# Patient Record
Sex: Female | Born: 1940 | Race: White | Hispanic: No | Marital: Married
Health system: Southern US, Community
[De-identification: ages and names within clinical notes are randomized; demographics above are authoritative.]

---

## 2009-10-27 ENCOUNTER — Ambulatory Visit: Payer: Self-pay | Admitting: Internal Medicine

## 2009-10-29 ENCOUNTER — Ambulatory Visit: Payer: Self-pay | Admitting: Internal Medicine

## 2009-10-31 ENCOUNTER — Ambulatory Visit: Payer: Self-pay | Admitting: Internal Medicine

## 2009-11-03 ENCOUNTER — Ambulatory Visit: Payer: Self-pay | Admitting: Internal Medicine

## 2010-07-04 ENCOUNTER — Ambulatory Visit: Payer: Self-pay | Admitting: Internal Medicine

## 2011-03-10 ENCOUNTER — Ambulatory Visit: Payer: Self-pay | Admitting: Internal Medicine

## 2013-10-20 ENCOUNTER — Ambulatory Visit: Payer: Self-pay | Admitting: Unknown Physician Specialty

## 2013-10-20 LAB — URINALYSIS, COMPLETE
Blood: NEGATIVE
Glucose,UR: NEGATIVE mg/dL (ref 0–75)
Nitrite: NEGATIVE
Ph: 5 (ref 4.5–8.0)
SPECIFIC GRAVITY: 1.015 (ref 1.003–1.030)

## 2013-10-22 LAB — URINE CULTURE

## 2013-11-17 ENCOUNTER — Ambulatory Visit: Payer: Self-pay | Admitting: Family Medicine

## 2013-11-23 LAB — BASIC METABOLIC PANEL
Anion Gap: 6 — ABNORMAL LOW (ref 7–16)
BUN: 22 mg/dL — ABNORMAL HIGH (ref 7–18)
CALCIUM: 8.8 mg/dL (ref 8.5–10.1)
CHLORIDE: 96 mmol/L — AB (ref 98–107)
CO2: 28 mmol/L (ref 21–32)
Creatinine: 1.15 mg/dL (ref 0.60–1.30)
EGFR (African American): 54 — ABNORMAL LOW
EGFR (Non-African Amer.): 47 — ABNORMAL LOW
Glucose: 147 mg/dL — ABNORMAL HIGH (ref 65–99)
Osmolality: 267 (ref 275–301)
POTASSIUM: 4.2 mmol/L (ref 3.5–5.1)
Sodium: 130 mmol/L — ABNORMAL LOW (ref 136–145)

## 2013-11-23 LAB — URINALYSIS, COMPLETE
BILIRUBIN, UR: NEGATIVE
BLOOD: NEGATIVE
Bacteria: NONE SEEN
Ketone: NEGATIVE
Leukocyte Esterase: NEGATIVE
NITRITE: NEGATIVE
PH: 5 (ref 4.5–8.0)
Protein: 30
RBC,UR: 1 /HPF (ref 0–5)
SPECIFIC GRAVITY: 1.025 (ref 1.003–1.030)
Squamous Epithelial: 1
WBC UR: 3 /HPF (ref 0–5)

## 2013-11-23 LAB — CBC WITH DIFFERENTIAL/PLATELET
Basophil #: 0 10*3/uL (ref 0.0–0.1)
Basophil %: 0.1 %
EOS ABS: 0 10*3/uL (ref 0.0–0.7)
EOS PCT: 0.2 %
HCT: 32.3 % — AB (ref 35.0–47.0)
HGB: 11 g/dL — ABNORMAL LOW (ref 12.0–16.0)
LYMPHS PCT: 1.9 %
Lymphocyte #: 0.3 10*3/uL — ABNORMAL LOW (ref 1.0–3.6)
MCH: 30.1 pg (ref 26.0–34.0)
MCHC: 33.9 g/dL (ref 32.0–36.0)
MCV: 89 fL (ref 80–100)
MONOS PCT: 5.9 %
Monocyte #: 1 x10 3/mm — ABNORMAL HIGH (ref 0.2–0.9)
NEUTROS ABS: 15 10*3/uL — AB (ref 1.4–6.5)
NEUTROS PCT: 91.9 %
Platelet: 272 10*3/uL (ref 150–440)
RBC: 3.64 10*6/uL — AB (ref 3.80–5.20)
RDW: 13.1 % (ref 11.5–14.5)
WBC: 16.3 10*3/uL — AB (ref 3.6–11.0)

## 2013-11-24 ENCOUNTER — Inpatient Hospital Stay: Payer: Self-pay | Admitting: Internal Medicine

## 2013-11-24 LAB — CBC WITH DIFFERENTIAL/PLATELET
BASOS ABS: 0 10*3/uL (ref 0.0–0.1)
Basophil %: 0.3 %
Eosinophil #: 0 10*3/uL (ref 0.0–0.7)
Eosinophil %: 0.1 %
HCT: 31.4 % — AB (ref 35.0–47.0)
HGB: 10.7 g/dL — ABNORMAL LOW (ref 12.0–16.0)
LYMPHS ABS: 0.4 10*3/uL — AB (ref 1.0–3.6)
LYMPHS PCT: 2.5 %
MCH: 30.2 pg (ref 26.0–34.0)
MCHC: 34 g/dL (ref 32.0–36.0)
MCV: 89 fL (ref 80–100)
Monocyte #: 1.2 x10 3/mm — ABNORMAL HIGH (ref 0.2–0.9)
Monocyte %: 7.7 %
NEUTROS PCT: 89.4 %
Neutrophil #: 13.8 10*3/uL — ABNORMAL HIGH (ref 1.4–6.5)
Platelet: 238 10*3/uL (ref 150–440)
RBC: 3.53 10*6/uL — AB (ref 3.80–5.20)
RDW: 12.9 % (ref 11.5–14.5)
WBC: 15.4 10*3/uL — ABNORMAL HIGH (ref 3.6–11.0)

## 2013-11-24 LAB — BASIC METABOLIC PANEL
ANION GAP: 6 — AB (ref 7–16)
BUN: 16 mg/dL (ref 7–18)
CHLORIDE: 97 mmol/L — AB (ref 98–107)
CREATININE: 0.91 mg/dL (ref 0.60–1.30)
Calcium, Total: 8.3 mg/dL — ABNORMAL LOW (ref 8.5–10.1)
Co2: 29 mmol/L (ref 21–32)
GLUCOSE: 111 mg/dL — AB (ref 65–99)
OSMOLALITY: 266 (ref 275–301)
POTASSIUM: 3.8 mmol/L (ref 3.5–5.1)
Sodium: 132 mmol/L — ABNORMAL LOW (ref 136–145)

## 2013-11-25 LAB — CREATININE, SERUM: Creatinine: 0.93 mg/dL (ref 0.60–1.30)

## 2013-11-26 LAB — BASIC METABOLIC PANEL
Anion Gap: 9 (ref 7–16)
BUN: 21 mg/dL — AB (ref 7–18)
CREATININE: 0.73 mg/dL (ref 0.60–1.30)
Calcium, Total: 8.3 mg/dL — ABNORMAL LOW (ref 8.5–10.1)
Chloride: 101 mmol/L (ref 98–107)
Co2: 30 mmol/L (ref 21–32)
EGFR (Non-African Amer.): >60
Glucose: 97 mg/dL (ref 65–99)
Osmolality: 282 (ref 275–301)
POTASSIUM: 3.7 mmol/L (ref 3.5–5.1)
SODIUM: 140 mmol/L (ref 136–145)

## 2013-11-26 LAB — CBC WITH DIFFERENTIAL/PLATELET
BASOS PCT: 0.2 %
Basophil #: 0 10*3/uL (ref 0.0–0.1)
EOS ABS: 0.2 10*3/uL (ref 0.0–0.7)
Eosinophil %: 1.6 %
HCT: 31.2 % — AB (ref 35.0–47.0)
HGB: 10.3 g/dL — ABNORMAL LOW (ref 12.0–16.0)
Lymphocyte #: 0.8 10*3/uL — ABNORMAL LOW (ref 1.0–3.6)
Lymphocyte %: 6.9 %
MCH: 29.8 pg (ref 26.0–34.0)
MCHC: 33.1 g/dL (ref 32.0–36.0)
MCV: 90 fL (ref 80–100)
MONOS PCT: 7 %
Monocyte #: 0.9 x10 3/mm (ref 0.2–0.9)
Neutrophil #: 10.3 10*3/uL — ABNORMAL HIGH (ref 1.4–6.5)
Neutrophil %: 84.3 %
Platelet: 269 10*3/uL (ref 150–440)
RBC: 3.47 10*6/uL — AB (ref 3.80–5.20)
RDW: 13 % (ref 11.5–14.5)
WBC: 12.2 10*3/uL — ABNORMAL HIGH (ref 3.6–11.0)

## 2013-11-26 LAB — VANCOMYCIN, TROUGH: VANCOMYCIN, TROUGH: 5 ug/mL — AB (ref 10–20)

## 2013-11-26 LAB — CULTURE, BLOOD (SINGLE)

## 2013-11-27 LAB — VANCOMYCIN, TROUGH: VANCOMYCIN, TROUGH: 13 ug/mL (ref 10–20)

## 2013-12-03 LAB — CULTURE, BLOOD (SINGLE)

## 2013-12-18 ENCOUNTER — Ambulatory Visit: Payer: Self-pay | Admitting: Family Medicine

## 2013-12-23 ENCOUNTER — Emergency Department: Payer: Self-pay | Admitting: Emergency Medicine

## 2014-01-30 ENCOUNTER — Emergency Department: Payer: Self-pay | Admitting: Emergency Medicine

## 2014-02-17 DEATH — deceased

## 2014-08-10 NOTE — Discharge Summary (Signed)
PATIENT NAME:  Briana ContrasKEATING, Jessenya M MR#:  [id number] DATE OF BIRTH:  [birth year]  DATE OF ADMISSION:  11/24/2013 DATE OF DISCHARGE:  11/28/2013  DISCHARGE DIAGNOSES:  1.  Clinical sepsis present on admission with methicillin-resistant Staphylococcus aureus growing in the blood culture. Will require 2 weeks of intravenous vancomycin through peripherally inserted central catheter line and moxifloxacin.  2.  Acute respiratory failure, now resolved.   SECONDARY DIAGNOSES:  1.  Vascular dementia. 2.  Gastroesophageal reflux disease.  3.  Chronic obstructive pulmonary disease.  4.  Hypertension.  5.  Osteoarthritis.  6.  Known sacral decubitus ulcer.   CONSULTATIONS:  1.  Surgery, Dr. Dionne Miloichard Cooper.  2.  Infectious disease, Dr. Clydie Braunavid Fitzgerald.   PROCEDURES AND RADIOLOGY: Chest x-ray on August 11 showed placement of right upper extremity PICC line with tape terminating at the mid superior vena cava. No acute cardiopulmonary disease.   Chest x-ray on August 7 showed no acute cardiopulmonary disease.   A 2-D echocardiogram on August 9 showed LVEF of 55%-60%, moderately increased LV septal thickness. Mild to moderate aortic valve sclerosis/calcification without any evidence of aortic stenosis. Moderately increased LV posterior wall thickness.   MAJOR LABORATORY PANEL: Urinalysis on admission was negative.   Blood culture x 2 were growing MRSA.   HISTORY AND SHORT HOSPITAL COURSE: The patient is a 74 year old female with the above-mentioned medical problems who was admitted for altered mental status. thought to have clinical sepsis present on admission from infected sacral decubitus ulcer. Please see Dr. Nicholes MangoKatherine Walsh's dictated history and physical for further details. The patient was started on IV antibiotic. Surgical consultation was obtained with Dr. Dionne Miloichard Cooper who recommended debridement, although considering her advanced dementia and comorbidities, family chose to keep her only on IV  antibiotics and no surgical debridement.  Palliative Care consultation was obtained with Dr. Harvie JuniorPhifer who had a discussion with family, but patient did not meet any hospice criteria at this point. Infectious disease consultation was obtained with Dr. Clydie Braunavid Fitzgerald who recommended 2 weeks of IV vancomycin along with moxifloxacin for ongoing treatment of infection including methicillin-resistant Staphylococcus aureus bacteremia from sacral decubitus ulcer along with possible any other infection. The patient was feeling a whole lot better, was evaluated by physical therapy and was recommended rehabilitation where she is being discharged in fair condition.   PHYSICAL EXAMINATION:  VITAL SIGNS: On the date of discharge, her vital signs are as follows: Temperature 98.1, heart rate 76 per minute, respirations 18 per minute, blood pressure 140/84 mmHg.  She is saturating 94% on room air.   PERTINENT PHYSICAL EXAMINATION ON THE DATE OF DISCHARGE:  CARDIOVASCULAR: S1, S2 normal. No murmurs, rubs, or gallop.  LUNGS: Clear to auscultation bilaterally. No wheezing, rales, rhonchi, or crepitation.  ABDOMEN: Soft and benign.  NEUROLOGIC: Nonfocal examination.  SKIN: She has about 8-10 cm area of erythema and induration over the sacrum, but hemorrhagic area of about 4 cm with full skin thickness ulceration of 2 x 2 cm covered with white eschar.  No drainage or odor.   ALL OTHER PHYSICAL EXAMINATION REMAINED AT BASELINE.   DISCHARGE MEDICATIONS:  1.  Amlodipine 5 mg p.o. daily.  2.  Acetaminophen 650 mg p.o. 3 times a day. 3.  Aspirin 81 mg p.o. daily.  4.  Citalopram 20 mg p.o. daily.  5.  Losartan 50 mg p.o. daily.  6.  Multivitamin once daily.  7.  Nabumetone 500 mg 1 tablet p.o. b.i.d.  8.  Omeprazole 40 mg p.o. b.i.d.  9.  Pravastatin 20 mg p.o. daily.  10.  Qvar 2 puffs inhaled twice a day.  11.  Spiriva once daily.  12.  ProAir 2 puffs inhaled every 4 hours as needed. 13.  Vancomycin 1 gram IV  Twice daily for 14 days.  14.  Moxifloxacin 400 mg p.o. daily for 14 days.   DISCHARGE DIET: Regular.   DISCHARGE ACTIVITY: As tolerated.   DISCHARGE INSTRUCTIONS AND FOLLOW-UP: The patient was instructed to have a dressing change twice a day in the sacral area of the Santyl dressing material. Dressing needs to be kept dry. She will need to PICC line care per protocol.  She was also instructed to have Ensure drinks 3 times a day with each meal. She will get physical therapy evaluation and management while at the facility. She will need to be seeing her primary care physician or physician at the facility in the next 1 week. She was instructed to follow up with Dr. Clydie Braun in 2 weeks.   TOTAL TIME DISCHARGING THIS PATIENT: 45 minutes.   She remains at very high risk for readmission. Her CODE STATUS is DO NOT RESUSCITATE.    ____________________________ Vanette Noguchi S. Sherryll Burger, MD vss:TT D: 11/28/2013 11:12:38 ET T: 11/28/2013 12:13:03 ET JOB#: [id number]  cc: Gelene Recktenwald S. Sherryll Burger, MD, <Dictator> Richard E. Excell Seltzer, MD Stann Mainland. Sampson Goon, MD Unknown Primary Care Physician  Ellamae Sia [facility] MD ELECTRONICALLY SIGNED 11/29/2013 18:59

## 2014-08-10 NOTE — Discharge Summary (Signed)
PATIENT NAME:  Briana ContrasKEATING, Fritzi M MR#:  [id number] DATE OF BIRTH:  1940/06/10  DATE OF ADMISSION:  11/24/2013 DATE OF DISCHARGE:    ADDENDUM:    The only change is that the vancomycin dosing will be 1000 mg twice a day or every 12, as opposed to 1000 mg daily.  The next trough should be checked prior to the fourth dose, with a goal trough of 15 to 20.    ____________________________ Stann Mainlandavid P. Sampson GoonFitzgerald, MD dpf:nt D: 11/28/2013 13:53:51 ET T: 11/28/2013 14:01:37 ET JOB#: [id number]  cc: Stann Mainlandavid P. Sampson GoonFitzgerald, MD, <Dictator> Blair Lundeen Sampson GoonFITZGERALD MD ELECTRONICALLY SIGNED 12/07/2013 22:24

## 2014-08-10 NOTE — Consult Note (Signed)
PATIENT NAME:  Briana Poole, [name] MR#:  [id number] DATE OF BIRTH:  1940-06-05  DATE OF CONSULTATION:  11/24/2013  REFERRING PHYSICIAN:   CONSULTING PHYSICIAN:  Adah Salvageichard E. Excell Seltzerooper, MD  CHIEF COMPLAINT: Sacral decubitus.   HISTORY OF PRESENT ILLNESS: This is an elderly patient with a sacral decubitus I was asked to see for evaluation. She was apparently admitted with septic signs. No family is present at this time. History is from the chart, as the patient is essentially incoherent and responds only to voice and painful stimulus, but does not give coherent answers to any questioning.   PAST MEDICAL HISTORY: Dementia, reflux disease, COPD, hypertension, arthritis, and a prior sacral decubitus ulcer.   PAST SURGICAL HISTORY: Not obtainable.   MEDICATIONS: Multiple, see chart.   ALLERGIES: AMOXICILLIN, SULFA, CYMBALTA AND PENICILLIN.   FAMILY HISTORY: Not obtainable.   SOCIAL HISTORY: The patient is a resident at an assisted living facility. Does not smoke or drink.   REVIEW OF SYSTEMS: Not obtainable.   PHYSICAL EXAMINATION: GENERAL: Afebrile. The patient preferring to lay in the left lateral recumbent position. She awakens to voice, but only very loud voice and to palpation stimuli (no family is present during the examination).   With the assistance of the nurse, the patient is rolled to her left side and dressing is taken down. There is erythema, which is minimal. There is no expressible pus. There is an apparent and obvious sacral decubitus measuring approximately 2.5 x 3 cm.   LABORATORY VALUES: Sodium 132, white blood cell count of 15, hemoglobin and hematocrit 10.7 and 32. Cultures show gram-positive cocci in the blood.   ASSESSMENT AND PLAN: This is a patient with a sacral decubitus. My initial impression was that this patient would benefit from IV antibiotics and possible debridement if there is no rapid response with the erythema; however, I see Dr. Renae GlossWieting has talked to the family  who is currently not present, who have discussed possible and likely comfort care only. I am in full agreement with this plan and would be available if needed. We will continue to follow the patient in case something changes at this time.    ____________________________ Adah Salvageichard E. Excell Seltzerooper, MD rec:at D: 11/24/2013 12:14:24 ET T: 11/24/2013 12:24:52 ET JOB#: [id number]  cc: Adah Salvageichard E. Excell Seltzerooper, MD, <Dictator> Lattie HawICHARD E Camari Wisham MD ELECTRONICALLY SIGNED 11/24/2013 16:46

## 2014-08-10 NOTE — H&P (Signed)
PATIENT NAME:  Briana Poole, Briana Poole MR#:  [id number] DATE OF BIRTH:  03/25/1941  DATE OF ADMISSION:  11/23/2013  PRIMARY CARE PHYSICIAN: Unknown at this time.  REFERRING PHYSICIAN:  Dr. Clydene PughWoodard.   CHIEF COMPLAINT: Altered mental status.   HISTORY OF PRESENT ILLNESS: The patient is a poor historian due to advanced dementia. The history is obtained from her living facility. Per their report, the patient has been less active for the past few days. She is usually up and able to walk, recently she has been remaining in the bed. There have been no fevers, sweats, shortness of breath, specific complaints. She has a sacral ulcer which has been getting more inflamed and large over the past few weeks.  PAST MEDICAL HISTORY: 1.  Vascular dementia.  2.  Gastroesophageal reflux disease.  3.  COPD.  4.  Hypertension.  5.  Osteoarthritis.  6.  Known sacral decubitus ulcer.   PAST SURGICAL HISTORY: The patient has had surgery on her lungs in the past.   HOME MEDICATIONS:  1.  Spiriva 18 mcg inhalation capsule inhale 1 capsule once a day.  2.  Qvar 80 mcg/inhalations 2 puffs inhaled twice a day.  3.  ProAir HFA CFC free 90 mcg/inhalation aerosol inhaler 2 puffs every 4 hours as needed for shortness of breath.  4.  Pravastatin 20 mg 1 tablet daily.  5.  Omeprazole 40 mg 1 capsule 2 times a day.  6.  Nabumetone 500 mg oral tablet 1 tablet twice a day.  7.  Multivitamin once a day.  8.  Losartan 50 mg 1 tablet daily.  9.  Citalopram 20 mg 1 tablet daily.  10.  Aspirin 81 mg 1 tablet daily.  11.  Amlodipine 5 mg 1 tablet daily.  12.  Acetaminophen 650 mg 1 tablet orally 3 times a day.   ALLERGIES: THE PATIENT IS ALLERGIC TO AMOXICILLIN WHICH CAUSES RASH, SULFA DRUGS, CYMBALTA, FLU VACCINATION, AND PENICILLIN.   SOCIAL HISTORY: The patient is a resident at an assisted living facility. She does not smoke or drink alcohol. Otherwise, unable to obtain due to her advanced dementia.   FAMILY HISTORY: Unable  to obtain due to advanced dementia.   REVIEW OF SYSTEMS: Unable to obtain due to her advanced dementia.   PHYSICAL EXAMINATION:  VITAL SIGNS: Temperature 99.3, pulse 94, respirations 27, blood pressure 145/85, pulse oximeter 95% on room air.  GENERAL: The patient is alert, she is not oriented. She seems comfortable in the exam bed, calm.  HEENT: Pupils are equal, round, and reactive, conjunctiva are clear, oral mucous membranes are pink and moist, good dentition.  NECK: Supple, no lymphadenopathy, no thyromegaly.  PULMONARY: The patient does not participate well with the examination. Lungs are clear to auscultation bilaterally.   CARDIOVASCULAR: Regular rate and rhythm, no murmurs, rubs, or gallops.  ABDOMEN: Bowel sounds are normal, abdomen seems slightly tender to touch diffusely. There is no point of greater tenderness. There is no mass, no guarding, no rebound, no hepatosplenomegaly.  MUSCULOSKELETAL: The patient is very thin, muscles are atrophied. There are no swollen or tender joints. She is able to move all 4 extremities spontaneously.   NEUROLOGIC: The patient is unable to perform the neurologic exam due to advanced dementia. She is not oriented to person, place, or situation.  SKIN: there is a 8-10 cm area of erythema and induration over the sacrum with a central hemoragic area of about 4 cm and a full skin thickness ulceration of 2cmx2cm which is  covered with a white eschar.  No drainage, no odor.  LABORATORY DATA:  Sodium 130, potassium 4.2, chloride 96, bicarbonate 28, BUN 22, creatinine 1.15, glucose 147. White blood cell count 16.3, hemoglobin 11.0, hematocrit 32.3, platelets 272,000. MCV is 89. Urinalysis shows no sign of infection.   ASSESSMENT AND PLAN:  1.  Infected sacral decubitus ulcer: Will cover broadly with vancomycin and clindamycin (patient is PCN allergic). Will obtain blood cultures. We will order a wound care consultation.   2.  Chronic obstructive pulmonary  disease: Continue home regimen, she is not having a chronic obstructive pulmonary disease exacerbation at this time.    3.  Hypertension. Continue home regimen.   4.  Gastroesophageal reflux disease: Continue proton pump inhibitor.   TIME SPENT THIS ADMISSION: 35 minutes.    ____________________________ Ena Dawley. Clent Ridges, MD cpw:ts D: 11/23/2013 18:26:57 ET T: 11/23/2013 19:41:06 ET JOB#: [id number]  cc: Santina Evans P. Clent Ridges, MD, <Dictator> Gale Journey MD ELECTRONICALLY SIGNED 11/23/2013 22:21

## 2014-08-10 NOTE — Consult Note (Signed)
PATIENT NAME:  Antony ContrasKEATING, Modupe M MR#:  [id number] DATE OF BIRTH:  [birth year]  DATE OF CONSULTATION:  11/28/2013  REQUESTING PHYSICIAN:    Delfino LovettVipul Shah, MD    CONSULTING PHYSICIAN:  Stann Mainlandavid P. Sampson GoonFitzgerald, MD   REASON FOR CONSULTATION:  Methicillin resistant Staphylococcus aureus bacteremia and sacral decubitus ulcer.   HISTORY OF PRESENT ILLNESS: This is a 74 year old female admitted August 7th with altered mental status. She has a history of advanced dementia.  She had been increasingly bed bound and has developed a sacral ulcer which had increased in size and progression over the last month. On admission, the patient was started on vancomycin and clindamycin. She was seen by wound care. Blood cultures ended up being positive for MRSA.  We are consulted for further antibiotic management.   PAST MEDICAL HISTORY:  1.  Dementia, vascular in nature.  2.  GERD.  3.  COPD.   4.  Hypertension.  5.  Osteoarthritis.  6.  Sacral decubitus ulcer as above.   SURGICAL HISTORY:  Prior lung surgery, unknown type.   SOCIAL HISTORY: The patient is a resident at assisted living facility. No tobacco or alcohol or drugs.   FAMILY HISTORY: Unobtainable.   REVIEW OF SYSTEMS:  Unable to be obtained due to dementia.   ALLERGIES: AMOXICILLIN, SULFA, CYMBALTA, FLU VACCINE AND PENICILLIN.   ANTIBIOTICS. The patient was started on vancomycin and clindamycin.   PHYSICAL EXAMINATION:  VITAL SIGNS: Temperature 98.1, pulse 76, blood pressure 179/94, respirations 18, sat 94%. She had been afebrile throughout her hospitalization. GENERAL:  She is lying in bed. She has dementia.  Her pupils are equal, round, and reactive to light and accommodation.  Extraocular movements are intact.  OROPHARYNX: Clear.  NECK: Supple.  HEART: Regular.  LUNGS: Clear.  ABDOMEN: Soft, nontender, nondistended.  EXTREMITIES: No clubbing, cyanosis or edema.  SKIN: She has a stage III decubitus ulcer.   LABORATORY DATA:  Blood cultures  done on August 7th are positive for MRSA.  Follow-up blood cultures August 12th were no growth to date.  White blood count on admission was 16.3; currently, it is 12.2, hemoglobin 10.3, platelets 269. Urinalysis done on admission showed only 3 white cells. Echocardiogram showed no evidence of vegetations. Renal function shows a creatinine of 0.73.   IMAGING: Chest x-ray August 7th showed mild cardiac enlargement and mild chronic interstitial changes.   IMPRESSION: A 74 year old with vascular dementia admitted with sepsis due to a methicillin-resistant Staphylococcus aureus bacteremia from a sacral wound. She has been seen by surgery.      RECOMMENDATIONS:  1.  Repeating blood cultures today, which are negative to date.  2.  Continue vancomycin for a 2-week course.  3.  Add moxifloxacin for coverage of the other organisms likely present with the wound.  4.  Santyl for the wound and wound care.  5.  I can see the patient in 2 weeks in clinic.  Thank you for the consult. Be glad to follow with you.     ____________________________ Stann Mainlandavid P. Sampson GoonFitzgerald, MD dpf:DT D: 12/10/2013 11:45:43 ET T: 12/10/2013 13:19:34 ET JOB#: [id number]  cc: Stann Mainlandavid P. Sampson GoonFitzgerald, MD, <Dictator> Pavel Gadd Sampson GoonFITZGERALD MD ELECTRONICALLY SIGNED 12/27/2013 10:22

## 2014-08-10 NOTE — Consult Note (Signed)
Brief Consult Note: Diagnosis: MRSA bacteremia, sacral decubitis ulcer.   Patient was seen by consultant.   Consult note dictated.   Recommend to proceed with surgery or procedure.   Orders entered.   Discussed with Attending MD.   Comments: Continue vanco fo 2 weeks repeat bcx today Santyl to wound to help remove the exudate. WIll need good wound care Moxifloxacin in addition to the vanco for the infected sacral wound I can see in clinic in 2 weeks.  Electronic Signatures: Dierdre HarnessFitzgerald, David Patrick (MD)  (Signed 12-Aug-15 14:02)  Authored: Brief Consult Note   Last Updated: 12-Aug-15 14:02 by Dierdre HarnessFitzgerald, David Patrick (MD)

## 2016-03-24 IMAGING — CR DG CHEST 1V PORT
1 series · 1 of 1 positions shown · non-contrast
Comparison: None.

CLINICAL DATA: Shortness of breath

EXAM:
PORTABLE CHEST - 1 VIEW

[ap]
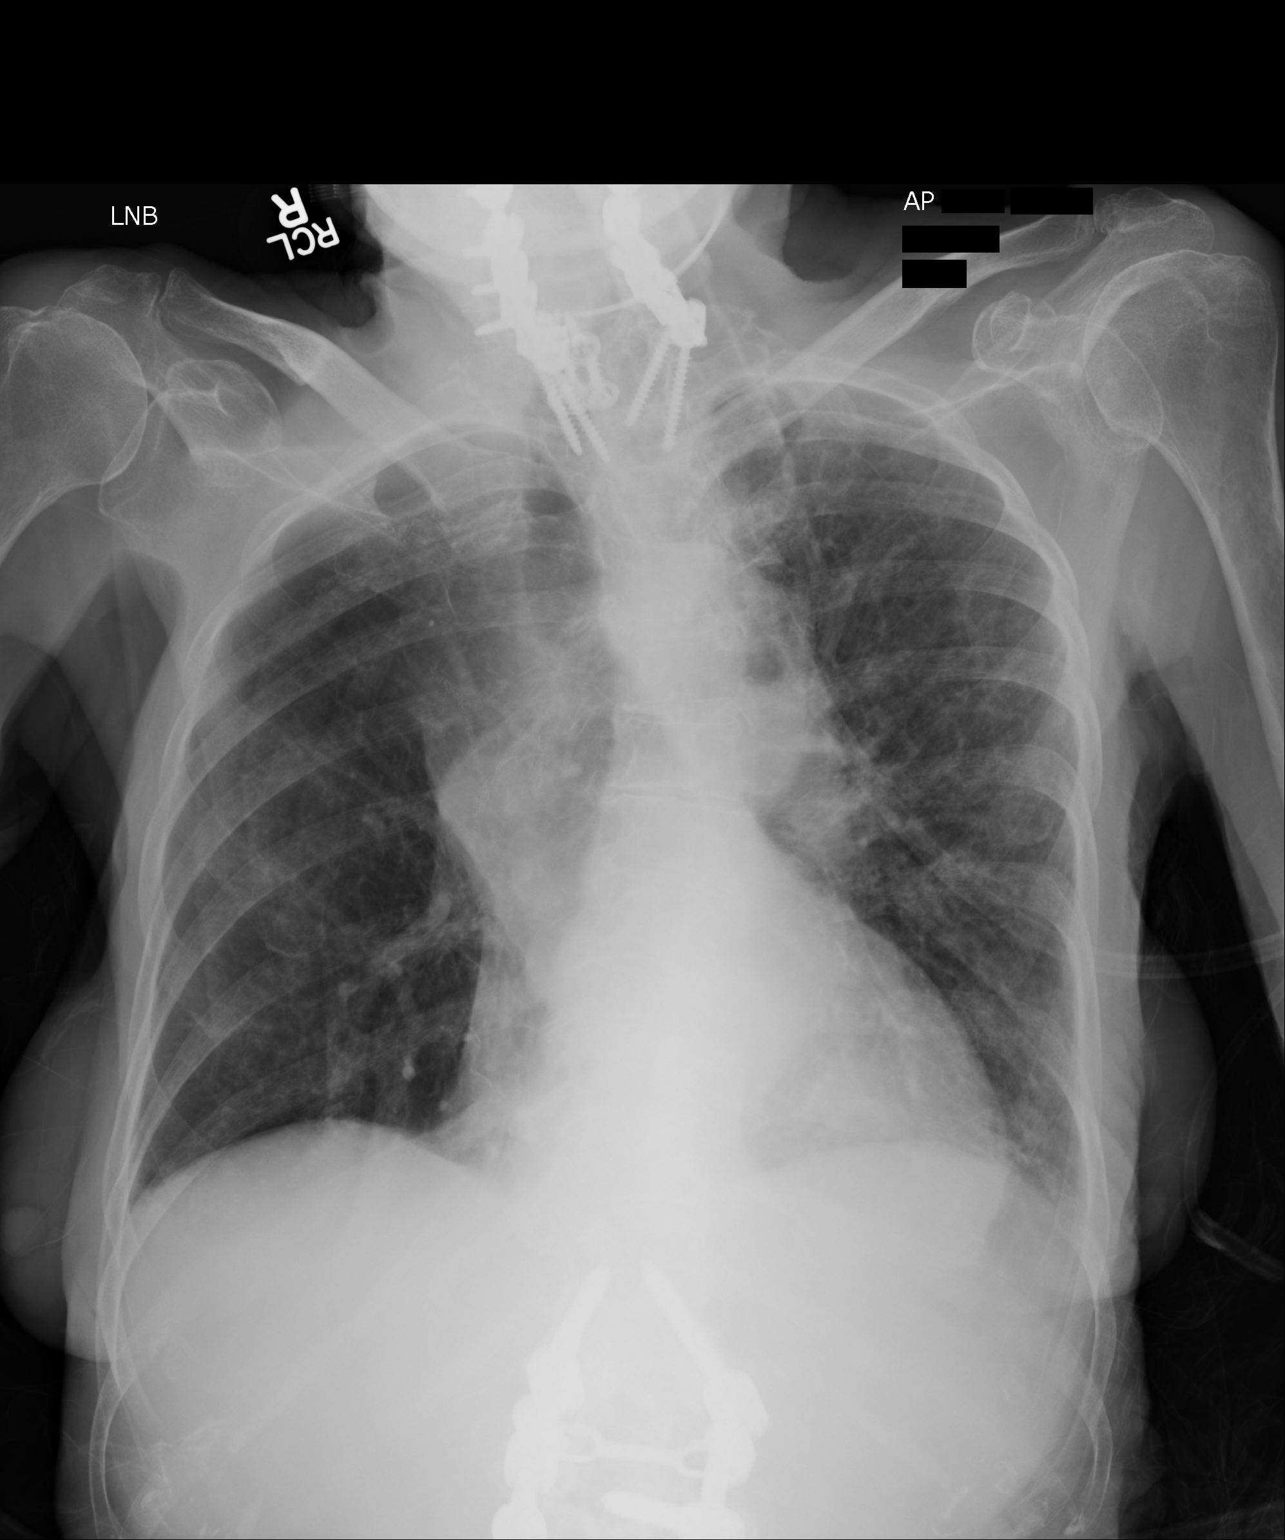

[1 of 1 positions shown; findings below may reference images not displayed]

FINDINGS: Mild cardiac enlargement. Vascular pattern normal. Mild diffuse
interstitial change, appearing more prominent on the left likely due
to rotation of patient. There is postsurgical change in the right
perihilar area. There is abnormal contour and prominence to the
right hilum. No consolidation or effusion. Extensive surgical change
to the cervical and lumbar spine.
IMPRESSION: Mild cardiac enlargement with normal vascular pattern. Mild
interstitial change diffusely but slightly more prominent on the
left allowing for rotation. Interstitial edema not excluded. Mild
chronic interstitial lung disease is another consideration.

There is abnormal contour to the right hilum. The appearance
suggests the result of prior surgical change in this area, although
right hilar mass or adenopathy is not excluded.

Findings could be evaluated in greater detail with CT thorax
particularly given the absence of comparison studies.

## 2016-05-31 IMAGING — CT CT HEAD WITHOUT CONTRAST
4 of 8 series · 15 of 37 positions shown, 16 images · non-contrast
Comparison: None.

CLINICAL DATA: Status post fall, hit head, laceration to the back
of the head

EXAM:
CT HEAD WITHOUT CONTRAST
CT CERVICAL SPINE WITHOUT CONTRAST
TECHNIQUE: Multidetector CT imaging of the head and cervical spine was
performed following the standard protocol without intravenous
contrast. Multiplanar CT image reconstructions of the cervical spine
were also generated.

[Series 2: head bone · axial · 0.39mm/px · z∈[-207,-95]mm · 5 of 85 slices shown (1 of 2)]
[im 15/85  bone]
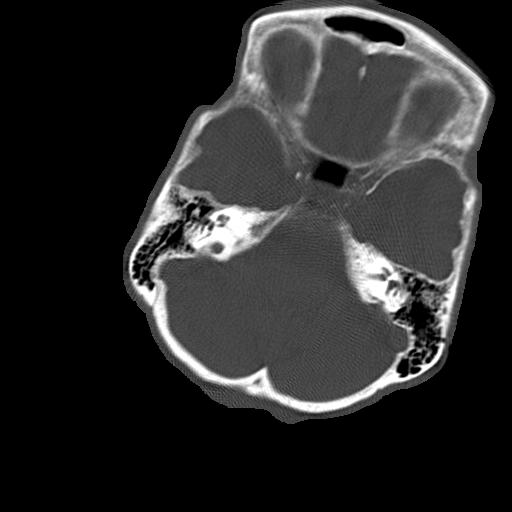
[im 29/85  bone]
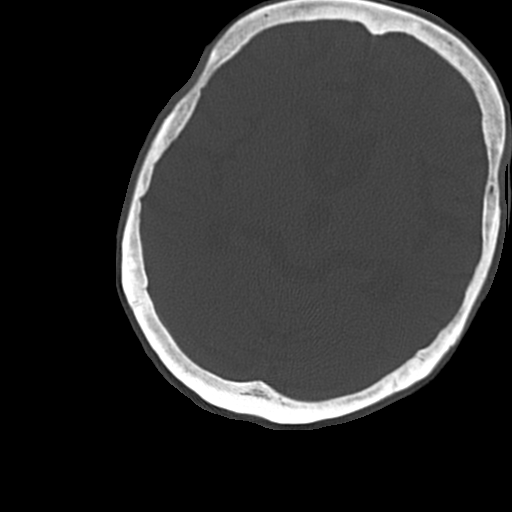
[im 43/85  bone]
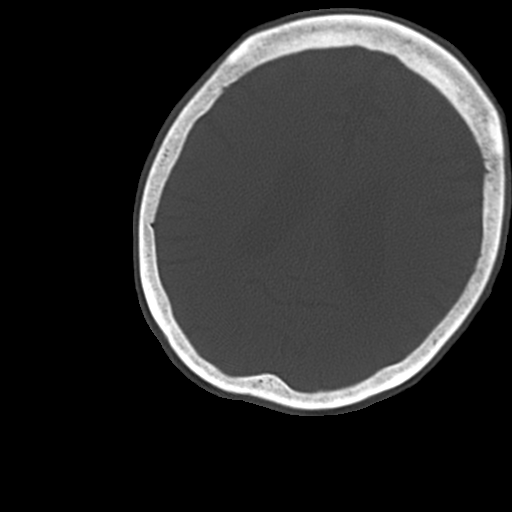
[im 57/85  bone]
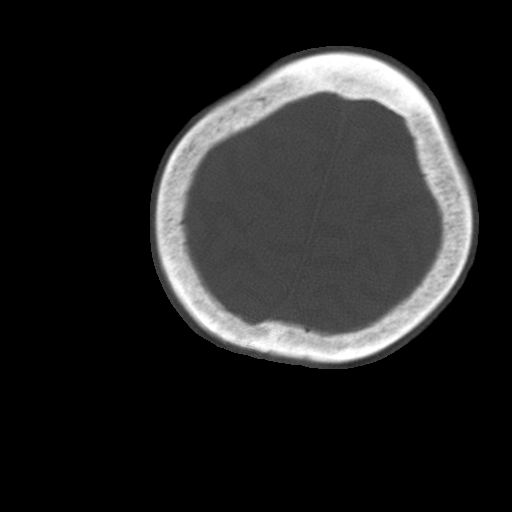
[im 71/85  bone]
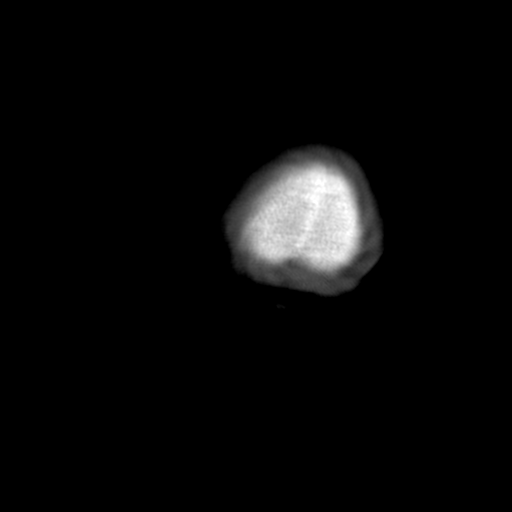

[Series 3: head wo · axial · 0.39mm/px · z∈[-226,-76]mm · 3 of 31 slices shown, 4 images]
[im 1/31  brain]
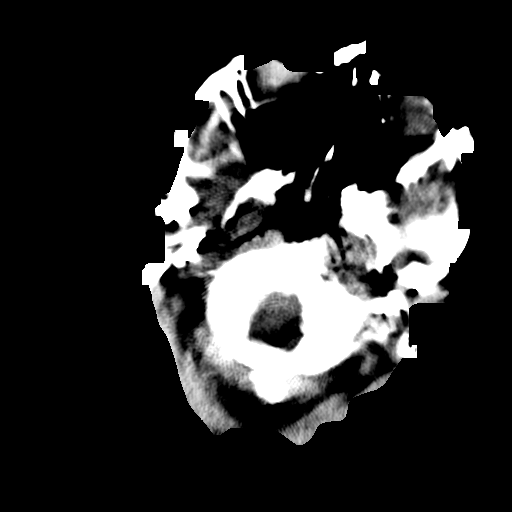
[im 1/31  bone]
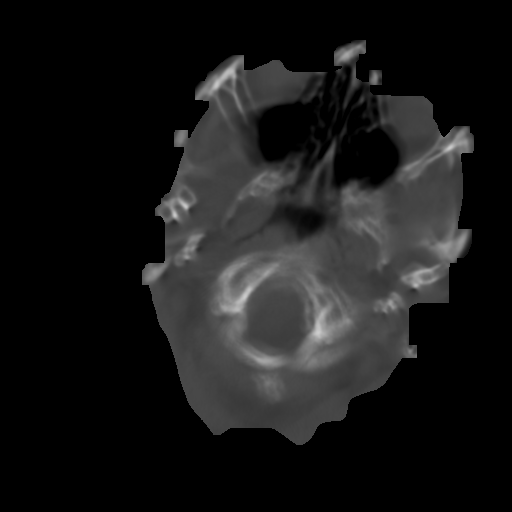
[im 16/31  brain]
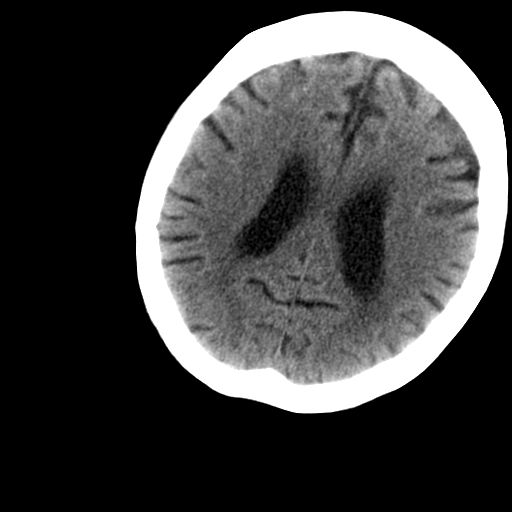
[im 31/31  brain]
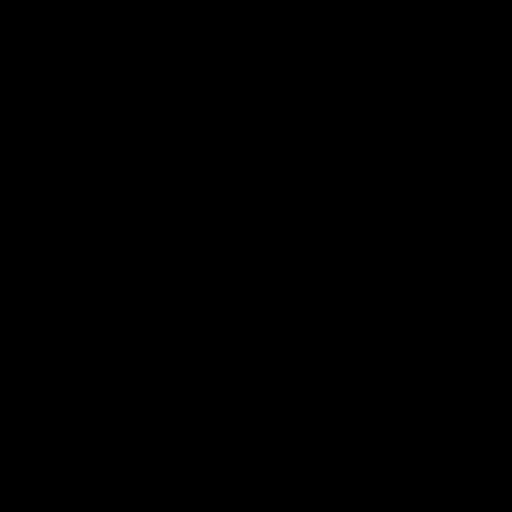

[Series 5: head bone · axial · 0.39mm/px · z∈[-203,-101]mm · 4 of 85 slices shown (2 of 2)]
[im 17/85  bone]
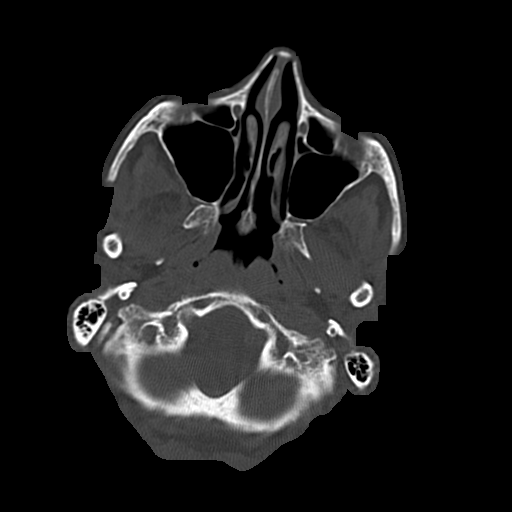
[im 34/85  bone]
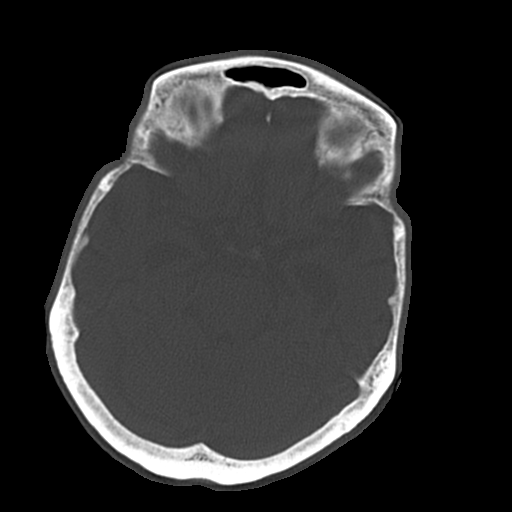
[im 51/85  bone]
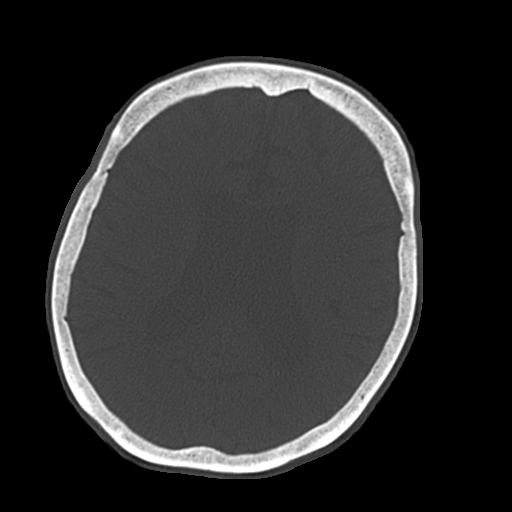
[im 68/85  bone]
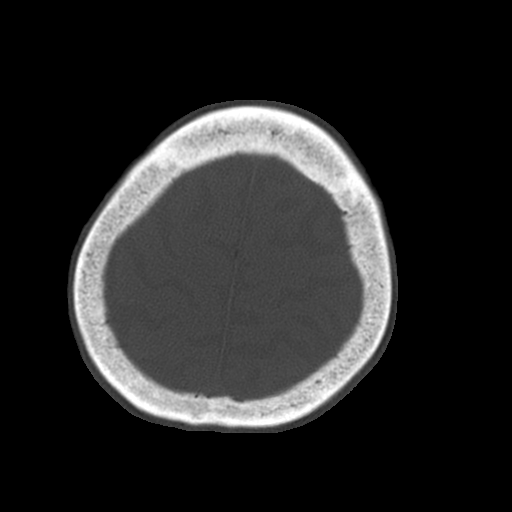

[Series 8: sag bone · sagittal · 0.29mm/px · 3 of 42 slices shown]
[im 14/42  brain]
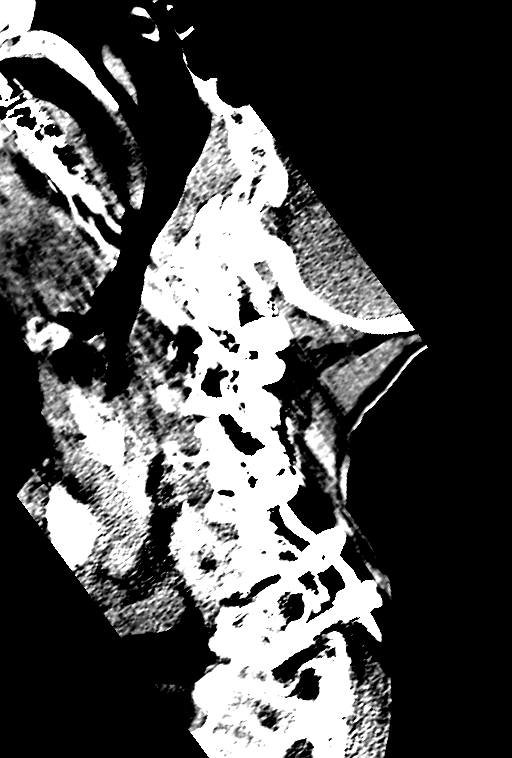
[im 21/42  brain]
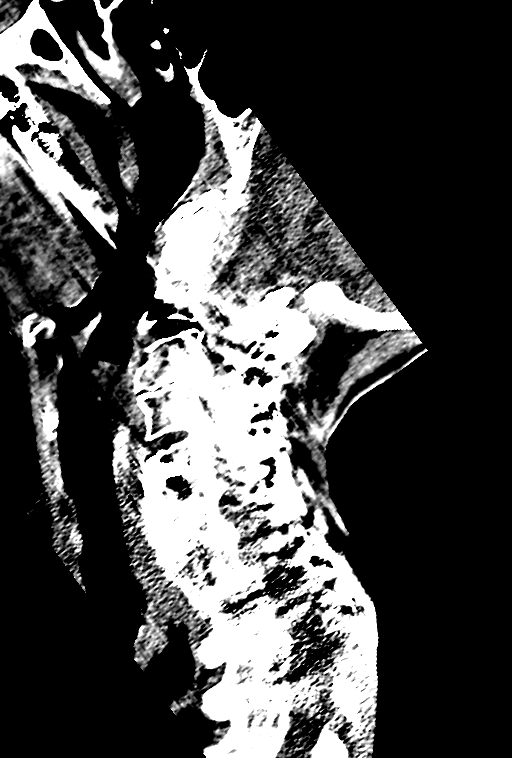
[im 28/42  brain]
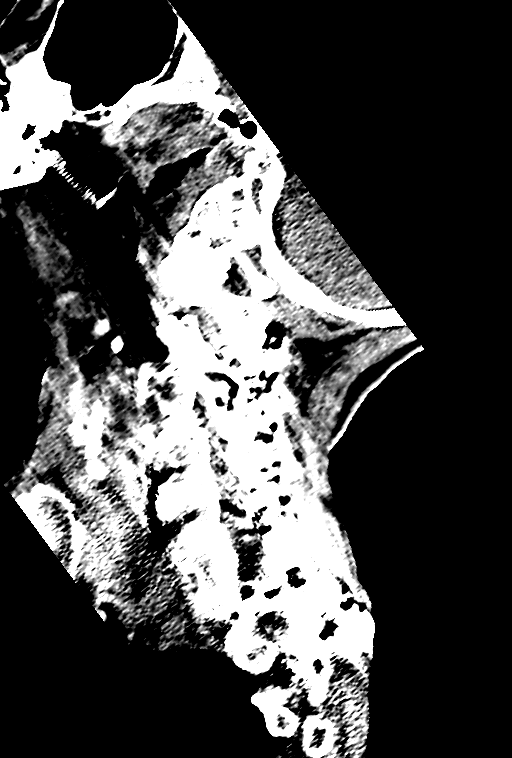

[15 of 37 positions shown; findings below may reference images not displayed]

FINDINGS: CT HEAD FINDINGS

There is no evidence of mass effect, midline shift, or extra-axial
fluid collections. There is no evidence of a space-occupying lesion
or intracranial hemorrhage. There is no evidence of a cortical-based
area of acute infarction. There is generalized cerebral atrophy.
There is periventricular white matter low attenuation likely
secondary to microangiopathy.

The ventricles and sulci are appropriate for the patient's age. The
basal cisterns are patent.

Visualized portions of the orbits are unremarkable. The visualized
portions of the paranasal sinuses and mastoid air cells are
unremarkable. Cerebrovascular atherosclerotic calcifications are
noted.

The osseous structures are unremarkable. There is right parietal
scalp soft tissue swelling.

CT CERVICAL SPINE FINDINGS

The alignment is anatomic. The vertebral body heights are
maintained. The space between the left lateral mass C1 and the dens
is increased compared with the right which may be related to spasm,
as no fracture is identified. There is no acute fracture. There is 3
mm of anterolisthesis of T1 on T2 secondary to facet arthropathy.The
prevertebral soft tissues are normal. The intraspinal soft tissues
are not fully imaged on this examination due to poor soft tissue
contrast, but there is no gross soft tissue abnormality.

There is anterior cervical fusion with osseous bridging across the
C5-6 disc space. There is severe degenerative disc disease at C3-4
with a broad-based disc osteophyte complex. There is severe
degenerative disc disease at C6-7, C7-T1, T1-2 and T2-3.

There is posterior spinal fusion from C2 through T2 without failure
or complication. There is loss of the normal cervical lordosis with
reversal which is in part likely related to the orthopedic hardware.

There are bilateral at this evidence changes.
IMPRESSION: 1. No acute intracranial pathology.
2. No acute osseous injury of the cervical spine.
3. Anterior cervical fusion at C5-6. Posterior cervical fusion from
C2 through T2 without failure or complication.
